# Patient Record
Sex: Male | Born: 1990 | Race: White | Hispanic: Yes | Marital: Single | Smoking: Never smoker
Health system: Southern US, Community
[De-identification: ages and names within clinical notes are randomized; demographics above are authoritative.]

## PROBLEM LIST (undated history)

## (undated) HISTORY — PX: NO PAST SURGERIES: SHX2092

---
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
## 2021-03-23 ENCOUNTER — Encounter (HOSPITAL_COMMUNITY): Payer: Self-pay | Admitting: *Deleted

## 2021-03-23 ENCOUNTER — Other Ambulatory Visit: Payer: Self-pay

## 2021-03-23 ENCOUNTER — Emergency Department (HOSPITAL_COMMUNITY)
Admission: EM | Admit: 2021-03-23 | Discharge: 2021-03-24 | Disposition: A | Discharge status: Home / Self Care | Payer: Self-pay | Attending: Emergency Medicine | Admitting: Emergency Medicine

## 2021-03-23 DIAGNOSIS — M7041 Prepatellar bursitis, right knee: Secondary | ICD-10-CM | POA: Insufficient documentation

## 2021-03-23 DIAGNOSIS — D72829 Elevated white blood cell count, unspecified: Secondary | ICD-10-CM | POA: Insufficient documentation

## 2021-03-23 DIAGNOSIS — Y939 Activity, unspecified: Secondary | ICD-10-CM | POA: Insufficient documentation

## 2021-03-23 DIAGNOSIS — L03115 Cellulitis of right lower limb: Secondary | ICD-10-CM | POA: Insufficient documentation

## 2021-03-23 MED ORDER — OXYCODONE-ACETAMINOPHEN 5-325 MG PO TABS
1.0000 | ORAL_TABLET | Freq: Once | ORAL | Status: AC
  Administered 2021-03-24: 1 via ORAL
  Filled 2021-03-23: qty 1

## 2021-03-23 MED ORDER — IBUPROFEN 400 MG PO TABS
600.0000 mg | ORAL_TABLET | Freq: Once | ORAL | Status: AC
  Administered 2021-03-24: 600 mg via ORAL
  Filled 2021-03-23: qty 1

## 2021-03-23 NOTE — ED Triage Notes (Addendum)
Emergency Medicine Provider Triage Evaluation Note  Henry Austin , a 30 y.o. male  was evaluated in triage.  Pt complains of right knee pain worsening today.  Took Tylenol around 2 PM today.  Review of Systems  Positive: Right knee pain, subjective fever Negative: Numbness, weakness, trauma  Physical Exam  BP 130/75 (BP Location: Left Arm)   Pulse (!) 106   Temp 99.6 F (37.6 C) (Oral)   Resp 16   SpO2 99%  Gen:   Awake Resp:  Normal effort  Cardiac:  Normal rate, right DP and PT pulses 2+ MSK:   Tenderness, swelling, increased warmth, erythema to the right knee.  Significant pain with attempted range of motion of the right knee. Neuro:  Speech clear.  Sensation light touch grossly intact in the right lower extremity.  Strength 5/5 in right ankle.  Medical Decision Making  Medically screening exam initiated at 11:35 PM.  Appropriate orders placed.  Henry Austin was informed that the remainder of the evaluation will be completed by another provider, this initial triage assessment does not replace that evaluation, and the importance of remaining in the ED until their evaluation is complete.  Clinical Impression   Patient seems to be quite uncomfortable in his right knee.  Significant pain with attempted range of motion.  Findings raise concern for possible infection.    Marijean Niemann #[id number], Spanish interpreter, was used used during this encounter.   Anselm Pancoast, PA-C 03/23/21 2348    Harolyn Rutherford C, PA-C 03/23/21 2352

## 2021-03-23 NOTE — ED Triage Notes (Signed)
Tpt alerthe pa saw this pt before myself  See te pas notes

## 2021-03-24 ENCOUNTER — Emergency Department (HOSPITAL_COMMUNITY): Payer: Self-pay

## 2021-03-24 LAB — BASIC METABOLIC PANEL
Anion gap: 10 (ref 5–15)
BUN: 23 mg/dL — ABNORMAL HIGH (ref 6–20)
CO2: 24 mmol/L (ref 22–32)
Calcium: 9.1 mg/dL (ref 8.9–10.3)
Chloride: 100 mmol/L (ref 98–111)
Creatinine, Ser: 1.12 mg/dL (ref 0.61–1.24)
GFR, Estimated: >60 mL/min (ref >60)
Glucose, Bld: 99 mg/dL (ref 70–99)
Potassium: 3.7 mmol/L (ref 3.5–5.1)
Sodium: 134 mmol/L — ABNORMAL LOW (ref 135–145)

## 2021-03-24 LAB — CBC WITH DIFFERENTIAL/PLATELET
Abs Immature Granulocytes: 0.18 10*3/uL — ABNORMAL HIGH (ref 0.00–0.07)
Basophils Absolute: 0.1 10*3/uL (ref 0.0–0.1)
Basophils Relative: 0 %
Eosinophils Absolute: 0 10*3/uL (ref 0.0–0.5)
Eosinophils Relative: 0 %
HCT: 42.2 % (ref 39.0–52.0)
Hemoglobin: 14.5 g/dL (ref 13.0–17.0)
Immature Granulocytes: 1 %
Lymphocytes Relative: 7 %
Lymphs Abs: 1.5 10*3/uL (ref 0.7–4.0)
MCH: 29.7 pg (ref 26.0–34.0)
MCHC: 34.4 g/dL (ref 30.0–36.0)
MCV: 86.5 fL (ref 80.0–100.0)
Monocytes Absolute: 1.5 10*3/uL — ABNORMAL HIGH (ref 0.1–1.0)
Monocytes Relative: 7 %
Neutro Abs: 17.8 10*3/uL — ABNORMAL HIGH (ref 1.7–7.7)
Neutrophils Relative %: 85 %
Platelets: 252 10*3/uL (ref 150–400)
RBC: 4.88 MIL/uL (ref 4.22–5.81)
RDW: 13.1 % (ref 11.5–15.5)
WBC: 21.1 10*3/uL — ABNORMAL HIGH (ref 4.0–10.5)
nRBC: 0 % (ref 0.0–0.2)

## 2021-03-24 LAB — LACTIC ACID, PLASMA: Lactic Acid, Venous: 1.3 mmol/L (ref 0.5–1.9)

## 2021-03-24 LAB — SEDIMENTATION RATE: Sed Rate: 2 mm/hr (ref 0–16)

## 2021-03-24 LAB — C-REACTIVE PROTEIN: CRP: 4.4 mg/dL — ABNORMAL HIGH (ref <1.0)

## 2021-03-24 MED ORDER — HYDROCODONE-ACETAMINOPHEN 5-325 MG PO TABS: 1.0000 | ORAL_TABLET | Freq: Three times a day (TID) | ORAL | 0 refills | Status: DC | PRN

## 2021-03-24 MED ORDER — HYDROMORPHONE HCL 1 MG/ML IJ SOLN
1.0000 mg | Freq: Once | INTRAMUSCULAR | Status: AC
  Administered 2021-03-24: 1 mg via INTRAVENOUS
  Filled 2021-03-24: qty 1

## 2021-03-24 MED ORDER — LIDOCAINE-EPINEPHRINE (PF) 2 %-1:200000 IJ SOLN
10.0000 mL | Freq: Once | INTRAMUSCULAR | Status: AC
  Administered 2021-03-24: 10 mL
  Filled 2021-03-24: qty 20

## 2021-03-24 MED ORDER — AMOXICILLIN-POT CLAVULANATE 875-125 MG PO TABS: 1.0000 | ORAL_TABLET | Freq: Two times a day (BID) | ORAL | 0 refills | Status: DC

## 2021-03-24 MED ORDER — CEFTRIAXONE SODIUM 2 G IJ SOLR
2.0000 g | Freq: Once | INTRAMUSCULAR | Status: AC
  Administered 2021-03-24: 2 g via INTRAVENOUS
  Filled 2021-03-24: qty 20
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
## 2021-03-24 NOTE — ED Notes (Signed)
Ortho Tech notified of need for crutches

## 2021-03-24 NOTE — Discharge Instructions (Signed)
For pain control you may take at 1000 mg of Tylenol every 8 hours scheduled.  In addition you can take 0.5 to 1 tablet of Norco/Vicodin every 8 hours for pain not controlled with the scheduled Tylenol.  Para controlar el dolor, puede tomar 1000 mg de Tylenol cada 8 horas programadas. Adems puede tomar de 0,5 a 1 pastilla de Norco/Vicodin cada 8 horas para el dolor no controlado con el Tylenol pautado.

## 2021-03-24 NOTE — ED Triage Notes (Signed)
Rt knee  Swollen with rt medial redness

## 2021-03-24 NOTE — Progress Notes (Signed)
Orthopedic Tech Progress Note Patient Details:  Henry Austin Aug 22, 1991 [id number]  Ortho Devices Type of Ortho Device: Crutches Ortho Device/Splint Interventions: Ordered,Adjustment   Post Interventions Patient Tolerated: Well Instructions Provided: Adjustment of device,Care of device,Poper ambulation with device   Henry Austin Harle Stanford 03/24/2021, 7:40 AM

## 2021-03-25 LAB — CULTURE, BLOOD (ROUTINE X 2)

## 2021-03-27 ENCOUNTER — Other Ambulatory Visit: Payer: Self-pay

## 2021-03-27 ENCOUNTER — Encounter: Payer: Self-pay | Admitting: Orthopaedic Surgery

## 2021-03-27 ENCOUNTER — Encounter (HOSPITAL_COMMUNITY): Payer: Self-pay | Admitting: Orthopaedic Surgery

## 2021-03-27 ENCOUNTER — Other Ambulatory Visit (HOSPITAL_COMMUNITY)
Admission: RE | Admit: 2021-03-27 | Discharge: 2021-03-27 | Disposition: A | Discharge status: Home / Self Care | Payer: Self-pay | Source: Ambulatory Visit | Attending: Orthopaedic Surgery | Admitting: Orthopaedic Surgery

## 2021-03-27 ENCOUNTER — Ambulatory Visit: Payer: Self-pay | Admitting: Orthopaedic Surgery

## 2021-03-27 DIAGNOSIS — M7051 Other bursitis of knee, right knee: Secondary | ICD-10-CM

## 2021-03-27 DIAGNOSIS — Z01812 Encounter for preprocedural laboratory examination: Secondary | ICD-10-CM | POA: Insufficient documentation

## 2021-03-27 DIAGNOSIS — Z20822 Contact with and (suspected) exposure to covid-19: Secondary | ICD-10-CM | POA: Insufficient documentation

## 2021-03-27 LAB — SARS CORONAVIRUS 2 (TAT 6-24 HRS): SARS Coronavirus 2: NEGATIVE

## 2021-03-27 MED ORDER — BUPIVACAINE HCL 0.25 % IJ SOLN: 2.0000 mL | INTRAMUSCULAR | Status: DC | PRN

## 2021-03-27 MED ORDER — LIDOCAINE HCL 1 % IJ SOLN: 2.0000 mL | INTRAMUSCULAR | Status: DC | PRN

## 2021-03-27 NOTE — Progress Notes (Signed)
Used [employer] ID [id number] for PAT iinformation and instructions for DOS.  PCP - none Cardiologist - n/a  Chest x-ray - n/a EKG - n/a Stress Test - n/a ECHO - n/a Cardiac Cath - n/a  STOP now taking any Aspirin (unless otherwise instructed by your surgeon), Aleve, Naproxen, Ibuprofen, Motrin, Advil, Goody's, BC's, all herbal medications, fish oil, and all vitamins.   Coronavirus Screening Covid test 03/27/21 is pending results Do you have any of the following symptoms:  Cough yes/no: No Fever (>100.74F)  yes/no: No Runny nose yes/no: No Sore throat yes/no: No Difficulty breathing/shortness of breath  yes/no: No  Have you traveled in the last 14 days and where? yes/no: No  Patient verbalized understanding of instructions that were given via phone by [employer].

## 2021-03-27 NOTE — Progress Notes (Signed)
Office Visit Note   Patient: Henry Austin           Date of Birth: February 26, 1991           MRN: [id number] Visit Date: 03/27/2021              Requested by: No referring provider defined for this encounter. PCP: Pcp, No   Assessment & Plan: Visit Diagnoses:  1. Patellar bursitis of right knee     Plan: Impression is right knee septic prepatellar bursitis.  Today, we aspirated the prepatellar bursa and obtained 5 cc of milky yellow fluid.  This was sent off for culture.  At this point, we recommend surgical debridement based on his lack of response from the antibiotics that he has been on over the past 4 days.  Risk, benefits and possible occasions reviewed.  Rehab recovery time discussed.  All questions were answered.  Follow-Up Instructions: Return for post-op.   Orders:  Orders Placed This Encounter  Procedures  . Large Joint Inj: R knee   No orders of the defined types were placed in this encounter.     Procedures: Large Joint Inj: R knee on 03/27/2021 10:22 AM Indications: pain Details: 22 G needle, anterolateral approach Medications: 2 mL lidocaine 1 %; 2 mL bupivacaine 0.25 %      Clinical Data: No additional findings.   Subjective: Chief Complaint  Patient presents with  . Right Knee - Pain    HPI patient is a pleasant 30 year old Spanish-speaking gentleman who comes in today with right knee pain for the past 5 days.  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  He denies any recent abrasion or bug bite.  He has had subjective fevers and chills Saturday and Sunday but notes that this somewhat improved Monday and Tuesday.  He is feeling better today but not great.  He was seen in the ED 4 days ago where he was started on Augmentin.  The pain he is having now is to the anterior aspect.  Worse with flexion of the knee as well as with ambulation.  He has been taking  Norco with relief of symptoms.  No history of gout.  No previous cortisone injection to the right knee.  Review of Systems as detailed in HPI.  All others reviewed and are negative.   Objective: Vital Signs: There were no vitals taken for this visit.  Physical Exam well-developed well-nourished gentleman in no acute distress.  Alert and oriented x3.  Ortho Exam right knee exam shows mild to moderate swelling over the prepatellar bursa.  This is slightly erythematous and tender.  There is warmth as well.  No induration.  No drainage.  Range of motion 0 to 110 degrees.  No joint line tenderness.  He is neurovascular intact distally.  Specialty Comments:  No specialty comments available.  Imaging: No new imaging   PMFS History: There are no problems to display for this patient.  History reviewed. No pertinent past medical history.  History reviewed. No pertinent family history.  History reviewed. No pertinent surgical history. Social History   Occupational History  . Not on file  Tobacco Use  . Smoking status: Not on file  . Smokeless tobacco: Not on file  Substance and Sexual Activity  . Alcohol use: Not on file  . Drug use: Not on file  . Sexual activity:  Not on file

## 2021-03-27 NOTE — Progress Notes (Deleted)
  The note originally documented on this encounter has been moved the the encounter in which it belongs.  

## 2021-03-28 ENCOUNTER — Encounter (HOSPITAL_COMMUNITY)
Admission: RE | Disposition: A | Discharge status: Home / Self Care | Payer: Self-pay | Source: Home / Self Care | Attending: Orthopaedic Surgery

## 2021-03-28 ENCOUNTER — Ambulatory Visit (HOSPITAL_COMMUNITY): Payer: Self-pay | Admitting: Certified Registered Nurse Anesthetist

## 2021-03-28 ENCOUNTER — Ambulatory Visit (HOSPITAL_COMMUNITY)
Admission: RE | Admit: 2021-03-28 | Discharge: 2021-03-28 | Disposition: A | Discharge status: Home / Self Care | Payer: Self-pay | Attending: Orthopaedic Surgery | Admitting: Orthopaedic Surgery

## 2021-03-28 ENCOUNTER — Other Ambulatory Visit: Payer: Self-pay

## 2021-03-28 ENCOUNTER — Encounter (HOSPITAL_COMMUNITY): Payer: Self-pay | Admitting: Orthopaedic Surgery

## 2021-03-28 DIAGNOSIS — M71161 Other infective bursitis, right knee: Secondary | ICD-10-CM

## 2021-03-28 DIAGNOSIS — M7041 Prepatellar bursitis, right knee: Secondary | ICD-10-CM | POA: Insufficient documentation

## 2021-03-28 HISTORY — PX: IRRIGATION AND DEBRIDEMENT KNEE: SHX5185

## 2021-03-28 LAB — CBC WITH DIFFERENTIAL/PLATELET
Abs Immature Granulocytes: 0.09 10*3/uL — ABNORMAL HIGH (ref 0.00–0.07)
Basophils Absolute: 0.1 10*3/uL (ref 0.0–0.1)
Basophils Relative: 1 %
Eosinophils Absolute: 0.2 10*3/uL (ref 0.0–0.5)
Eosinophils Relative: 2 %
HCT: 42.4 % (ref 39.0–52.0)
Hemoglobin: 14.2 g/dL (ref 13.0–17.0)
Immature Granulocytes: 1 %
Lymphocytes Relative: 21 %
Lymphs Abs: 2.3 10*3/uL (ref 0.7–4.0)
MCH: 29.1 pg (ref 26.0–34.0)
MCHC: 33.5 g/dL (ref 30.0–36.0)
MCV: 86.9 fL (ref 80.0–100.0)
Monocytes Absolute: 1 10*3/uL (ref 0.1–1.0)
Monocytes Relative: 9 %
Neutro Abs: 7.3 10*3/uL (ref 1.7–7.7)
Neutrophils Relative %: 66 %
Platelets: 359 10*3/uL (ref 150–400)
RBC: 4.88 MIL/uL (ref 4.22–5.81)
RDW: 13.3 % (ref 11.5–15.5)
WBC: 10.9 10*3/uL — ABNORMAL HIGH (ref 4.0–10.5)
nRBC: 0 % (ref 0.0–0.2)

## 2021-03-28 LAB — CULTURE, BLOOD (ROUTINE X 2)

## 2021-03-28 SURGERY — IRRIGATION AND DEBRIDEMENT KNEE
Anesthesia: General | Site: Knee | Laterality: Right

## 2021-03-28 MED ORDER — LIDOCAINE 2% (20 MG/ML) 5 ML SYRINGE
INTRAMUSCULAR | Status: AC
  Filled 2021-03-28: qty 5

## 2021-03-28 MED ORDER — VANCOMYCIN HCL 500 MG IV SOLR
INTRAVENOUS | Status: AC
  Filled 2021-03-28: qty 500

## 2021-03-28 MED ORDER — LACTATED RINGERS IV SOLN: INTRAVENOUS | Status: DC

## 2021-03-28 MED ORDER — ACETAMINOPHEN 500 MG PO TABS: 1000.0000 mg | ORAL_TABLET | Freq: Once | ORAL | Status: DC

## 2021-03-28 MED ORDER — MIDAZOLAM HCL 5 MG/5ML IJ SOLN
INTRAMUSCULAR | Status: DC | PRN
  Administered 2021-03-28: 2 mg via INTRAVENOUS

## 2021-03-28 MED ORDER — VANCOMYCIN HCL 500 MG IV SOLR
INTRAVENOUS | Status: DC | PRN
  Administered 2021-03-28: 500 mg via TOPICAL

## 2021-03-28 MED ORDER — CEFAZOLIN SODIUM-DEXTROSE 2-3 GM-%(50ML) IV SOLR
INTRAVENOUS | Status: DC | PRN
  Administered 2021-03-28: 2 g via INTRAVENOUS

## 2021-03-28 MED ORDER — DEXAMETHASONE SODIUM PHOSPHATE 10 MG/ML IJ SOLN
INTRAMUSCULAR | Status: DC | PRN
  Administered 2021-03-28: 10 mg via INTRAVENOUS

## 2021-03-28 MED ORDER — DEXAMETHASONE SODIUM PHOSPHATE 10 MG/ML IJ SOLN
INTRAMUSCULAR | Status: AC
  Filled 2021-03-28: qty 1

## 2021-03-28 MED ORDER — BUPIVACAINE-EPINEPHRINE 0.25% -1:200000 IJ SOLN
INTRAMUSCULAR | Status: DC | PRN
  Administered 2021-03-28: 5 mL

## 2021-03-28 MED ORDER — LIDOCAINE 2% (20 MG/ML) 5 ML SYRINGE
INTRAMUSCULAR | Status: DC | PRN
  Administered 2021-03-28: 60 mg via INTRAVENOUS

## 2021-03-28 MED ORDER — HYDROMORPHONE HCL 1 MG/ML IJ SOLN
INTRAMUSCULAR | Status: AC
  Filled 2021-03-28: qty 1

## 2021-03-28 MED ORDER — CHLORHEXIDINE GLUCONATE 0.12 % MT SOLN
15.0000 mL | Freq: Once | OROMUCOSAL | Status: AC
  Administered 2021-03-28: 15 mL via OROMUCOSAL

## 2021-03-28 MED ORDER — OXYCODONE-ACETAMINOPHEN 5-325 MG PO TABS: 1.0000 | ORAL_TABLET | Freq: Three times a day (TID) | ORAL | 0 refills | Status: DC | PRN

## 2021-03-28 MED ORDER — SODIUM CHLORIDE 0.9 % IR SOLN
Status: DC | PRN
  Administered 2021-03-28: 6000 mL

## 2021-03-28 MED ORDER — ONDANSETRON HCL 4 MG/2ML IJ SOLN
INTRAMUSCULAR | Status: AC
  Filled 2021-03-28: qty 2

## 2021-03-28 MED ORDER — PROPOFOL 10 MG/ML IV BOLUS
INTRAVENOUS | Status: AC
  Filled 2021-03-28: qty 20

## 2021-03-28 MED ORDER — BUPIVACAINE HCL (PF) 0.25 % IJ SOLN
INTRAMUSCULAR | Status: AC
  Filled 2021-03-28: qty 30

## 2021-03-28 MED ORDER — FENTANYL CITRATE (PF) 250 MCG/5ML IJ SOLN
INTRAMUSCULAR | Status: DC | PRN
  Administered 2021-03-28: 50 ug via INTRAVENOUS

## 2021-03-28 MED ORDER — MIDAZOLAM HCL 2 MG/2ML IJ SOLN
INTRAMUSCULAR | Status: AC
  Filled 2021-03-28: qty 2

## 2021-03-28 MED ORDER — HYDROMORPHONE HCL 1 MG/ML IJ SOLN
0.2500 mg | INTRAMUSCULAR | Status: DC | PRN
  Administered 2021-03-28: 0.25 mg via INTRAVENOUS
  Administered 2021-03-28: 0.5 mg via INTRAVENOUS
  Administered 2021-03-28: 0.25 mg via INTRAVENOUS

## 2021-03-28 MED ORDER — ONDANSETRON HCL 4 MG/2ML IJ SOLN
INTRAMUSCULAR | Status: DC | PRN
  Administered 2021-03-28: 4 mg via INTRAVENOUS

## 2021-03-28 MED ORDER — CELECOXIB 200 MG PO CAPS
200.0000 mg | ORAL_CAPSULE | Freq: Once | ORAL | Status: AC
  Administered 2021-03-28: 200 mg via ORAL
  Filled 2021-03-28: qty 1

## 2021-03-28 MED ORDER — FENTANYL CITRATE (PF) 250 MCG/5ML IJ SOLN
INTRAMUSCULAR | Status: AC
  Filled 2021-03-28: qty 5

## 2021-03-28 MED ORDER — PROPOFOL 10 MG/ML IV BOLUS
INTRAVENOUS | Status: DC | PRN
  Administered 2021-03-28: 200 mg via INTRAVENOUS

## 2021-03-28 SURGICAL SUPPLY — 48 items
BNDG CMPR MED 15X6 ELC VLCR LF (GAUZE/BANDAGES/DRESSINGS) ×1
BNDG COHESIVE 6X5 TAN STRL LF (GAUZE/BANDAGES/DRESSINGS) ×3 IMPLANT
BNDG ELASTIC 6X15 VLCR STRL LF (GAUZE/BANDAGES/DRESSINGS) ×1 IMPLANT
BNDG ELASTIC 6X5.8 VLCR STR LF (GAUZE/BANDAGES/DRESSINGS) ×1 IMPLANT
BNDG GAUZE ELAST 4 BULKY (GAUZE/BANDAGES/DRESSINGS) ×1 IMPLANT
COVER SURGICAL LIGHT HANDLE (MISCELLANEOUS) ×2 IMPLANT
COVER WAND RF STERILE (DRAPES) ×2 IMPLANT
CUFF TOURN SGL QUICK 34 (TOURNIQUET CUFF)
CUFF TOURN SGL QUICK 42 (TOURNIQUET CUFF) ×2 IMPLANT
CUFF TRNQT CYL 34X4.125X (TOURNIQUET CUFF) IMPLANT
DRAPE EXTREMITY T 121X128X90 (DISPOSABLE) ×1 IMPLANT
DRAPE IMP U-DRAPE 54X76 (DRAPES) ×2 IMPLANT
DRAPE ORTHO SPLIT 77X108 STRL (DRAPES) ×4
DRAPE SURG ORHT 6 SPLT 77X108 (DRAPES) ×1 IMPLANT
ELECT CAUTERY BLADE 6.4 (BLADE) ×4 IMPLANT
ELECT REM PT RETURN 9FT ADLT (ELECTROSURGICAL) ×2
ELECTRODE REM PT RTRN 9FT ADLT (ELECTROSURGICAL) ×1 IMPLANT
FACESHIELD WRAPAROUND (MASK) IMPLANT
FACESHIELD WRAPAROUND OR TEAM (MASK) IMPLANT
GAUZE SPONGE 4X4 12PLY STRL LF (GAUZE/BANDAGES/DRESSINGS) ×1 IMPLANT
GLOVE ECLIPSE 7.0 STRL STRAW (GLOVE) ×2 IMPLANT
GLOVE SKINSENSE NS SZ7.5 (GLOVE) ×4
GLOVE SKINSENSE STRL SZ7.5 (GLOVE) ×4 IMPLANT
GLOVE SURG UNDER POLY LF SZ7 (GLOVE) ×2 IMPLANT
GOWN STRL REIN XL XLG (GOWN DISPOSABLE) ×4 IMPLANT
HANDPIECE INTERPULSE COAX TIP (DISPOSABLE) ×4
KIT BASIN OR (CUSTOM PROCEDURE TRAY) ×2 IMPLANT
KIT TURNOVER KIT B (KITS) ×2 IMPLANT
MANIFOLD NEPTUNE II (INSTRUMENTS) ×2 IMPLANT
NS IRRIG 1000ML POUR BTL (IV SOLUTION) ×2 IMPLANT
PACK ORTHO EXTREMITY (CUSTOM PROCEDURE TRAY) ×2 IMPLANT
PACK UNIVERSAL I (CUSTOM PROCEDURE TRAY) ×2 IMPLANT
PAD ARMBOARD 7.5X6 YLW CONV (MISCELLANEOUS) ×4 IMPLANT
SET CYSTO W/LG BORE CLAMP LF (SET/KITS/TRAYS/PACK) ×1 IMPLANT
SET HNDPC FAN SPRY TIP SCT (DISPOSABLE) ×1 IMPLANT
SPONGE LAP 18X18 RF (DISPOSABLE) ×2 IMPLANT
STOCKINETTE IMPERVIOUS 9X36 MD (GAUZE/BANDAGES/DRESSINGS) ×2 IMPLANT
SUT ETHILON 2 0 FS 18 (SUTURE) ×2 IMPLANT
SUT MON AB 2-0 CT1 36 (SUTURE) ×2 IMPLANT
SWAB CULTURE ESWAB REG 1ML (MISCELLANEOUS) ×3 IMPLANT
SWAB CULTURE LIQ STUART DBL (MISCELLANEOUS) ×1 IMPLANT
SYR 20CC LL (SYRINGE) ×1 IMPLANT
TOWEL GREEN STERILE (TOWEL DISPOSABLE) ×2 IMPLANT
TOWEL GREEN STERILE FF (TOWEL DISPOSABLE) ×2 IMPLANT
TUBE CONNECTING 12X1/4 (SUCTIONS) ×2 IMPLANT
UNDERPAD 30X36 HEAVY ABSORB (UNDERPADS AND DIAPERS) ×2 IMPLANT
WATER STERILE IRR 1000ML POUR (IV SOLUTION) ×2 IMPLANT
YANKAUER SUCT BULB TIP NO VENT (SUCTIONS) ×2 IMPLANT

## 2021-03-28 NOTE — Anesthesia Postprocedure Evaluation (Signed)
Anesthesia Post Note  Patient: Henry Austin  Procedure(s) Performed: RIGHT KNEE PREPATELLAR  IRRIGATION AND DEBRIDEMENT (Right Knee)     Patient location during evaluation: PACU Anesthesia Type: General Level of consciousness: awake and alert Pain management: pain level controlled Vital Signs Assessment: post-procedure vital signs reviewed and stable Respiratory status: spontaneous breathing, nonlabored ventilation and respiratory function stable Cardiovascular status: blood pressure returned to baseline and stable Postop Assessment: no apparent nausea or vomiting Anesthetic complications: no   No complications documented.  Last Vitals:  Vitals:   03/28/21 1320 03/28/21 1335  BP: 120/83 125/86  Pulse: 70 67  Resp: 10 13  Temp:  36.6 C  SpO2: 98% 98%    Last Pain:  Vitals:   03/28/21 1335  PainSc: 4                  Humna Moorehouse,W. EDMOND

## 2021-03-28 NOTE — Op Note (Signed)
   Date of Surgery: 03/28/2021  INDICATIONS: Mr. Weightman is a 30 y.o.-year-old male with a right septic prepatellar bursitis that failed outpatient oral antibiotics.  The patient did consent to the procedure after discussion of the risks and benefits.  PREOPERATIVE DIAGNOSIS: Septic prepatellar bursitis right knee  POSTOPERATIVE DIAGNOSIS: Same.  PROCEDURE:  1.  Incision and drainage of right knee prepatellar bursa 2.  Excision of right knee prepatellar bursa 3.  Aspiration of right knee joint  Debridement type: Excisional Debridement  Side: right  Body Location: prepatellar bursa  Tools used for debridement: scalpel and rongeur  SURGEON: N. Glee Arvin, M.D.  ASSIST: Starlyn Skeans [place], [place]; necessary for the timely completion of procedure and due to complexity of procedure.  ANESTHESIA:  general  IV FLUIDS AND URINE: See anesthesia.  ESTIMATED BLOOD LOSS: Minimal mL.  IMPLANTS: None  DRAINS: Wet to dry  COMPLICATIONS: see description of procedure.  DESCRIPTION OF PROCEDURE: The patient was brought to the operating room.  The patient had been signed prior to the procedure and this was documented. The patient had the anesthesia placed by the anesthesiologist.  A time-out was performed to confirm that this was the correct patient, site, side and location. The patient did receive antibiotics prior to the incision and was re-dosed during the procedure as needed at indicated intervals.  A tourniquet was placed.  The patient had the operative extremity prepped and draped in the standard surgical fashion.    A longitudinal incision was made directly over the fluctuant bursa and there was return of frank pus immediately which was cultured.  Antibiotics were then started.  Sharp excisional debridement with a rondure was then performed of the prepatellar bursa.  The bursa was excised.  After I was pleased with the debridement this was then thoroughly irrigated with 6 L of normal  saline.  I then I aspirated the right knee joint which revealed just a couple cc of normal joint fluid.  There is no evidence of septic arthritis.  500 mg of vancomycin powder was placed in the open wound which was then packed with wet-to-dry dressings and a sterile dressing was applied.  Patient tolerated procedure well had no immediate complications.  Tessa Lerner was necessary for opening, closing, retracting, limb positioning and overall facilitation and timely completion of the procedure.  POSTOPERATIVE PLAN: Patient will be discharged home today we will follow-up on the cultures.  I have asked him to follow-up with me back in the office tomorrow so that we can initiate dressing changes.  For now he will remain on Augmentin.  Mayra Reel, MD 12:58 PM

## 2021-03-28 NOTE — H&P (Signed)

## 2021-03-28 NOTE — Anesthesia Procedure Notes (Signed)
Procedure Name: Intubation Date/Time: 03/28/2021 12:04 PM Performed by: Shary Decamp, CRNA Pre-anesthesia Checklist: Patient identified, Patient being monitored, Timeout performed, Emergency Drugs available and Suction available Patient Re-evaluated:Patient Re-evaluated prior to induction Oxygen Delivery Method: Circle system utilized Preoxygenation: Pre-oxygenation with 100% oxygen Induction Type: IV induction Ventilation: Mask ventilation without difficulty LMA Size: 5.0 Placement Confirmation: positive ETCO2 and breath sounds checked- equal and bilateral Tube secured with: Tape Dental Injury: Teeth and Oropharynx as per pre-operative assessment

## 2021-03-28 NOTE — Transfer of Care (Signed)
Immediate Anesthesia Transfer of Care Note  Patient: Henry Austin  Procedure(s) Performed: RIGHT KNEE PREPATELLAR  IRRIGATION AND DEBRIDEMENT (Right Knee)  Patient Location: PACU  Anesthesia Type:General  Level of Consciousness: drowsy, patient cooperative and responds to stimulation  Airway & Oxygen Therapy: Patient Spontanous Breathing and Patient connected to face mask oxygen  Post-op Assessment: Report given to RN and Post -op Vital signs reviewed and stable  Post vital signs: Reviewed and stable  Last Vitals:  Vitals Value Taken Time  BP    Temp    Pulse 76 03/28/21 1253  Resp 20 03/28/21 1253  SpO2 100 % 03/28/21 1253  Vitals shown include unvalidated device data.  Last Pain:  Vitals:   03/28/21 1122  PainSc: 4          Complications: No complications documented.

## 2021-03-28 NOTE — Discharge Instructions (Signed)
   Postoperative instructions:  Weightbearing instructions: activity as tolerated.  Do not kneel onto your knee.  Dressing instructions: Keep your dressing and/or splint clean and dry at all times.  It will be removed at your first post-operative appointment.  Your stitches and/or staples will be removed at this visit.  Pain control:  You have been given a prescription to be taken as directed for post-operative pain control.  In addition, elevate the operative extremity above the heart at all times to prevent swelling and throbbing pain.  Take over-the-counter Colace, 100mg  by mouth twice a day while taking narcotic pain medications to help prevent constipation.  Follow up appointments: 1) Follow up Friday 03/29/21 for wound check.  -------------------------------------------------------------------------------------------------------------  After Surgery Pain Control:  After your surgery, post-surgical discomfort or pain is likely. This discomfort can last several days to a few weeks. At certain times of the day your discomfort may be more intense.  Did you receive a nerve block?  A nerve block can provide pain relief for one hour to two days after your surgery. As long as the nerve block is working, you will experience little or no sensation in the area the surgeon operated on.  As the nerve block wears off, you will begin to experience pain or discomfort. It is very important that you begin taking your prescribed pain medication before the nerve block fully wears off. Treating your pain at the first sign of the block wearing off will ensure your pain is better controlled and more tolerable when full-sensation returns. Do not wait until the pain is intolerable, as the medicine will be less effective. It is better to treat pain in advance than to try and catch up.  General Anesthesia:  If you did not receive a nerve block during your surgery, you will need to start taking your pain medication  shortly after your surgery and should continue to do so as prescribed by your surgeon.  Pain Medication:  Most commonly we prescribe Vicodin and Percocet for post-operative pain. Both of these medications contain a combination of acetaminophen (Tylenol) and a narcotic to help control pain.   It takes between 30 and 45 minutes before pain medication starts to work. It is important to take your medication before your pain level gets too intense.   Nausea is a common side effect of many pain medications. You will want to eat something before taking your pain medicine to help prevent nausea.   If you are taking a prescription pain medication that contains acetaminophen, we recommend that you do not take additional over the counter acetaminophen (Tylenol).  Other pain relieving options:   Using a cold pack to ice the affected area a few times a day (15 to 20 minutes at a time) can help to relieve pain, reduce swelling and bruising.   Elevation of the affected area can also help to reduce pain and swelling.

## 2021-03-28 NOTE — Anesthesia Preprocedure Evaluation (Addendum)
Anesthesia Evaluation  Patient identified by MRN, date of birth, ID band Patient awake    Reviewed: Allergy & Precautions, H&P , NPO status , Patient's Chart, lab work & pertinent test results  Airway Mallampati: II  TM Distance: >3 FB Neck ROM: Full    Dental no notable dental hx. (+) Teeth Intact, Dental Advisory Given   Pulmonary neg pulmonary ROS,    Pulmonary exam normal breath sounds clear to auscultation       Cardiovascular negative cardio ROS   Rhythm:Regular Rate:Normal     Neuro/Psych negative neurological ROS  negative psych ROS   GI/Hepatic negative GI ROS, Neg liver ROS,   Endo/Other  negative endocrine ROS  Renal/GU negative Renal ROS  negative genitourinary   Musculoskeletal  (+) Arthritis ,   Abdominal   Peds  Hematology negative hematology ROS (+)   Anesthesia Other Findings   Reproductive/Obstetrics negative OB ROS                            Anesthesia Physical Anesthesia Plan  ASA: II  Anesthesia Plan: General   Post-op Pain Management:    Induction: Intravenous  PONV Risk Score and Plan: 3 and Ondansetron, Dexamethasone and Midazolam  Airway Management Planned: LMA  Additional Equipment:   Intra-op Plan:   Post-operative Plan: Extubation in OR  Informed Consent: I have reviewed the patients History and Physical, chart, labs and discussed the procedure including the risks, benefits and alternatives for the proposed anesthesia with the patient or authorized representative who has indicated his/her understanding and acceptance.     Dental advisory given and Interpreter used for interveiw  Plan Discussed with: CRNA  Anesthesia Plan Comments:        Anesthesia Quick Evaluation

## 2021-03-29 ENCOUNTER — Ambulatory Visit (INDEPENDENT_AMBULATORY_CARE_PROVIDER_SITE_OTHER): Payer: Self-pay | Admitting: Physician Assistant

## 2021-03-29 ENCOUNTER — Encounter (HOSPITAL_COMMUNITY): Payer: Self-pay | Admitting: Orthopaedic Surgery

## 2021-03-29 DIAGNOSIS — M71161 Other infective bursitis, right knee: Secondary | ICD-10-CM

## 2021-03-29 LAB — CULTURE, BLOOD (ROUTINE X 2)
Culture: NO GROWTH
Culture: NO GROWTH
Special Requests: ADEQUATE

## 2021-03-29 NOTE — Progress Notes (Signed)
   Post-Op Visit Note   Patient: Henry Austin           Date of Birth: 07-19-1991           MRN: [id number] Visit Date: 03/29/2021 PCP: Pcp, No   Assessment & Plan:  Chief Complaint:  Chief Complaint  Patient presents with  . Right Knee - Routine Post Op   Visit Diagnoses:  1. Septic prepatellar bursitis of right knee     Plan: Patient is a very pleasant 30 year old who comes in today with his wife.  He is postop day 1 irrigation and debridement left prepatellar bursa.  He has been doing okay.  He is taking oxycodone for pain relief.  No fevers or chills.  He is still taking his Augmentin.  Examination office right knee reveals slight warmth and erythema.  No induration.  Moderate tenderness.  Today, I removed the wet-to-dry dressings and repacked this with sterile 4 x 4's and sterile normal saline.  This was then wrapped with Kerlix and Ace bandage.  We also taught the wife how to do this at home.  She will do this twice daily over the next week.  He will continue with the Augmentin until the cultures have grown and we are able to tailor the antibiotic treatment.  We will see him back in the office in 1 week's time for recheck.  I did mark the erythematous area with a surgical marker.  He has been instructed to not get this area wet.  He will call us if he has any worsening symptoms, fevers or chills.  Follow-Up Instructions: Return in about 1 week (around 04/05/2021).   Orders:  No orders of the defined types were placed in this encounter.  No orders of the defined types were placed in this encounter.   Imaging: No new imaging  PMFS History: Patient Active Problem List   Diagnosis Date Noted  . Septic prepatellar bursitis of right knee 03/28/2021   History reviewed. No pertinent past medical history.  History reviewed. No pertinent family history.  Past Surgical History:  Procedure Laterality Date  . IRRIGATION AND DEBRIDEMENT KNEE Right 03/28/2021   Procedure: RIGHT KNEE  PREPATELLAR  IRRIGATION AND DEBRIDEMENT;  Surgeon: Tarry Kos, MD;  Location: [facility] OR;  Service: Orthopedics;  Laterality: Right;  . NO PAST SURGERIES     Social History   Occupational History  . Not on file  Tobacco Use  . Smoking status: Never Smoker  . Smokeless tobacco: Never Used  Vaping Use  . Vaping Use: Never used  Substance and Sexual Activity  . Alcohol use: Not Currently    Comment: none since 2020  . Drug use: Never  . Sexual activity: Yes

## 2021-04-02 LAB — ANAEROBIC AND AEROBIC CULTURE
MICRO NUMBER:: 11820911
MICRO NUMBER:: 11820912
SPECIMEN QUALITY:: ADEQUATE
SPECIMEN QUALITY:: ADEQUATE

## 2021-04-02 LAB — AEROBIC/ANAEROBIC CULTURE W GRAM STAIN (SURGICAL/DEEP WOUND): Gram Stain: NONE SEEN

## 2021-04-04 ENCOUNTER — Ambulatory Visit (INDEPENDENT_AMBULATORY_CARE_PROVIDER_SITE_OTHER): Payer: Self-pay | Admitting: Physician Assistant

## 2021-04-04 DIAGNOSIS — M71161 Other infective bursitis, right knee: Secondary | ICD-10-CM

## 2021-04-04 MED ORDER — AMOXICILLIN-POT CLAVULANATE 875-125 MG PO TABS: 1.0000 | ORAL_TABLET | Freq: Two times a day (BID) | ORAL | 0 refills | Status: AC

## 2021-04-04 MED ORDER — OXYCODONE-ACETAMINOPHEN 5-325 MG PO TABS: 1.0000 | ORAL_TABLET | Freq: Three times a day (TID) | ORAL | 0 refills | Status: DC | PRN

## 2021-04-04 NOTE — Progress Notes (Signed)
   Post-Op Visit Note   Patient: Henry Austin           Date of Birth: Aug 01, 1991           MRN: [id number] Visit Date: 04/04/2021 PCP: Pcp, No   Assessment & Plan:  Chief Complaint: No chief complaint on file.  Visit Diagnoses:  1. Septic prepatellar bursitis of right knee     Plan: Patient is a pleasant 30 year old gentleman who comes in today approximately 1 week status post excision right knee septic prepatellar bursa.  Intraoperative cultures grew  STREPTOCOCCUS AGALACTIAE.  He has been compliant taking the Augmentin.  He notes he only has 3 pills left.  He has also been taking Percocet for pain.  He notes that his symptoms have improved.  He has also been compliant with wet-to-dry dressing changes twice daily.  Examination of the right knee shows great granulation tissue to the wound.  No signs of infection.  There is no erythema or induration.  Calf soft nontender.  He is neurovascular intact distally.  Today, the wound was repacked with wet-to-dry dressings.  He will continue doing this twice daily.  I refilled his Augmentin and Percocet.  He will follow-up with [place] in 1 week's time for recheck.  Call with concerns or questions in the meantime.  Follow-Up Instructions: Return in about 1 week (around 04/11/2021).   Orders:  No orders of the defined types were placed in this encounter.  Meds ordered this encounter  Medications  . amoxicillin-clavulanate (AUGMENTIN) 875-125 MG tablet    Sig: Take 1 tablet by mouth every 12 (twelve) hours for 14 days.    Dispense:  28 tablet    Refill:  0  . oxyCODONE-acetaminophen (PERCOCET) 5-325 MG tablet    Sig: Take 1-2 tablets by mouth every 8 (eight) hours as needed for severe pain.    Dispense:  20 tablet    Refill:  0    Imaging: No new imaging  PMFS History: Patient Active Problem List   Diagnosis Date Noted  . Septic prepatellar bursitis of right knee 03/28/2021   No past medical history on file.  No family history on file.   Past Surgical History:  Procedure Laterality Date  . IRRIGATION AND DEBRIDEMENT KNEE Right 03/28/2021   Procedure: RIGHT KNEE PREPATELLAR  IRRIGATION AND DEBRIDEMENT;  Surgeon: Tarry Kos, MD;  Location: [facility] OR;  Service: Orthopedics;  Laterality: Right;  . NO PAST SURGERIES     Social History   Occupational History  . Not on file  Tobacco Use  . Smoking status: Never Smoker  . Smokeless tobacco: Never Used  Vaping Use  . Vaping Use: Never used  Substance and Sexual Activity  . Alcohol use: Not Currently    Comment: none since 2020  . Drug use: Never  . Sexual activity: Yes

## 2021-04-11 ENCOUNTER — Other Ambulatory Visit: Payer: Self-pay | Admitting: Physician Assistant

## 2021-04-11 ENCOUNTER — Encounter: Payer: Self-pay | Admitting: Physician Assistant

## 2021-04-11 ENCOUNTER — Ambulatory Visit (INDEPENDENT_AMBULATORY_CARE_PROVIDER_SITE_OTHER): Payer: Self-pay | Admitting: Physician Assistant

## 2021-04-11 DIAGNOSIS — M71161 Other infective bursitis, right knee: Secondary | ICD-10-CM

## 2021-04-11 MED ORDER — OXYCODONE-ACETAMINOPHEN 5-325 MG PO TABS: 1.0000 | ORAL_TABLET | Freq: Two times a day (BID) | ORAL | 0 refills | Status: AC | PRN

## 2021-04-11 NOTE — Progress Notes (Signed)
   Post-Op Visit Note   Patient: Henry Austin           Date of Birth: [birth year]           MRN: [id number] Visit Date: 04/11/2021 PCP: Pcp, No   Assessment & Plan:  Chief Complaint:  Chief Complaint  Patient presents with  . Right Knee - Routine Post Op, Follow-up   Visit Diagnoses:  1. Septic prepatellar bursitis of right knee     Plan: Patient is a pleasant 30 year old gentleman who comes in today 2 weeks out right knee excision septic prepatellar bursa.  He has been doing well.  He has been compliant with wet-to-dry dressing changes twice daily.  He has been compliant with taking his Augmentin as well.  No fevers or chills.  He notes that his pain has much improved.  Examination of his right knee shows continued granulation tissue to the wound.  He has very minimal erythema and warmth.  Calf is soft nontender.  He is neurovascularly intact distally.  Today, the wound was redressed.  They will continue with wet-to-dry dressings over the next week.  He will continue with his Augmentin.  I refilled his Percocet.  He will follow-up with [place] in 1 week's time for recheck.  Call with any concerns or questions in the meantime.  Follow-Up Instructions: Return in about 1 week (around 04/18/2021).   Orders:  No orders of the defined types were placed in this encounter.  No orders of the defined types were placed in this encounter.   Imaging: No new imaging  PMFS History: Patient Active Problem List   Diagnosis Date Noted  . Septic prepatellar bursitis of right knee 03/28/2021   History reviewed. No pertinent past medical history.  History reviewed. No pertinent family history.  Past Surgical History:  Procedure Laterality Date  . IRRIGATION AND DEBRIDEMENT KNEE Right 03/28/2021   Procedure: RIGHT KNEE PREPATELLAR  IRRIGATION AND DEBRIDEMENT;  Surgeon: Tarry Kos, MD;  Location: [facility] OR;  Service: Orthopedics;  Laterality: Right;  . NO PAST SURGERIES     Social History    Occupational History  . Not on file  Tobacco Use  . Smoking status: Never Smoker  . Smokeless tobacco: Never Used  Vaping Use  . Vaping Use: Never used  Substance and Sexual Activity  . Alcohol use: Not Currently    Comment: none since 2020  . Drug use: Never  . Sexual activity: Yes

## 2021-04-18 ENCOUNTER — Ambulatory Visit (INDEPENDENT_AMBULATORY_CARE_PROVIDER_SITE_OTHER): Payer: Self-pay | Admitting: Physician Assistant

## 2021-04-18 ENCOUNTER — Encounter: Payer: Self-pay | Admitting: Orthopaedic Surgery

## 2021-04-18 DIAGNOSIS — M71161 Other infective bursitis, right knee: Secondary | ICD-10-CM

## 2021-04-18 NOTE — Progress Notes (Signed)
   Post-Op Visit Note   Patient: Henry Austin           Date of Birth: 03-Sep-1991           MRN: [id number] Visit Date: 04/18/2021 PCP: Pcp, No   Assessment & Plan:  Chief Complaint:  Chief Complaint  Patient presents with  . Right Knee - Pain   Visit Diagnoses:  1. Septic prepatellar bursitis of right knee     Plan: Patient is a pleasant 30 year old gentleman who comes in today 3 weeks out right knee septic prepatellar bursa.  He has been been doing wet-to-dry dressing changes.  He has minimal pain which is relieved with Tylenol.  He is still taking his Augmentin.  Examination of the right knee reveals a nearly full heel wound without complication.  No signs of infection or cellulitis.  At this point, he no longer needs to do wet-to-dry dressing changes.  We have provided him with mupirocin and Band-Aids which she will use twice daily until this is completely healed.  We will avoid any kneeling until this is fully healed.  He will follow-up with [place] as needed.  Follow-Up Instructions: Return if symptoms worsen or fail to improve.   Orders:  No orders of the defined types were placed in this encounter.  No orders of the defined types were placed in this encounter.   Imaging: No new imaging  PMFS History: Patient Active Problem List   Diagnosis Date Noted  . Septic prepatellar bursitis of right knee 03/28/2021   History reviewed. No pertinent past medical history.  History reviewed. No pertinent family history.  Past Surgical History:  Procedure Laterality Date  . IRRIGATION AND DEBRIDEMENT KNEE Right 03/28/2021   Procedure: RIGHT KNEE PREPATELLAR  IRRIGATION AND DEBRIDEMENT;  Surgeon: Tarry Kos, MD;  Location: [facility] OR;  Service: Orthopedics;  Laterality: Right;  . NO PAST SURGERIES     Social History   Occupational History  . Not on file  Tobacco Use  . Smoking status: Never Smoker  . Smokeless tobacco: Never Used  Vaping Use  . Vaping Use: Never used   Substance and Sexual Activity  . Alcohol use: Not Currently    Comment: none since 2020  . Drug use: Never  . Sexual activity: Yes

## 2021-04-25 LAB — FUNGAL ORGANISM REFLEX

## 2021-04-25 LAB — FUNGUS CULTURE WITH STAIN

## 2021-04-25 LAB — FUNGUS CULTURE RESULT
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
## 2023-07-01 ENCOUNTER — Emergency Department (HOSPITAL_COMMUNITY)
Admission: EM | Admit: 2023-07-01 | Discharge: 2023-07-01 | Disposition: A | Discharge status: Home / Self Care | Payer: Self-pay | Attending: Emergency Medicine | Admitting: Emergency Medicine

## 2023-07-01 ENCOUNTER — Other Ambulatory Visit: Payer: Self-pay

## 2023-07-01 ENCOUNTER — Emergency Department (HOSPITAL_COMMUNITY): Payer: Self-pay

## 2023-07-01 ENCOUNTER — Encounter (HOSPITAL_COMMUNITY): Payer: Self-pay

## 2023-07-01 DIAGNOSIS — R7309 Other abnormal glucose: Secondary | ICD-10-CM | POA: Insufficient documentation

## 2023-07-01 DIAGNOSIS — R079 Chest pain, unspecified: Secondary | ICD-10-CM

## 2023-07-01 DIAGNOSIS — R072 Precordial pain: Secondary | ICD-10-CM | POA: Insufficient documentation

## 2023-07-01 DIAGNOSIS — I10 Essential (primary) hypertension: Secondary | ICD-10-CM | POA: Insufficient documentation

## 2023-07-01 LAB — BASIC METABOLIC PANEL
Anion gap: 11 (ref 5–15)
BUN: 19 mg/dL (ref 6–20)
CO2: 23 mmol/L (ref 22–32)
Calcium: 9.4 mg/dL (ref 8.9–10.3)
Chloride: 103 mmol/L (ref 98–111)
Creatinine, Ser: 1.04 mg/dL (ref 0.61–1.24)
GFR, Estimated: >60 mL/min (ref >60)
Glucose, Bld: 127 mg/dL — ABNORMAL HIGH (ref 70–99)
Potassium: 3.6 mmol/L (ref 3.5–5.1)
Sodium: 137 mmol/L (ref 135–145)

## 2023-07-01 LAB — TROPONIN I (HIGH SENSITIVITY)
Troponin I (High Sensitivity): 2 ng/L (ref <18)
Troponin I (High Sensitivity): 2 ng/L (ref <18)

## 2023-07-01 LAB — CBC
HCT: 44.6 % (ref 39.0–52.0)
Hemoglobin: 15.5 g/dL (ref 13.0–17.0)
MCH: 30.2 pg (ref 26.0–34.0)
MCHC: 34.8 g/dL (ref 30.0–36.0)
MCV: 86.8 fL (ref 80.0–100.0)
Platelets: 267 10*3/uL (ref 150–400)
RBC: 5.14 MIL/uL (ref 4.22–5.81)
RDW: 13.2 % (ref 11.5–15.5)
WBC: 7.9 10*3/uL (ref 4.0–10.5)
nRBC: 0 % (ref 0.0–0.2)

## 2023-07-01 NOTE — ED Triage Notes (Signed)
Triage done using spanish ipad interpreter: pt reports left sided chest pain (pressure), cold sweats and left arm numbness that started last night but worsened today.

## 2023-07-01 NOTE — Discharge Instructions (Signed)
You were seen in the emergency department today for chest pain.  As we discussed your lab work, EKG, chest x-ray all looked reassuring today.  I think that your symptoms could likely been related to a muscle strain.   You can take some tylenol for pain, or use a warm compress over the area.   Continue to monitor how you are doing overall, and return to the emergency department for any new or worsening symptoms such as: Worsening pain or pain with exertion, difficulty breathing, sweating, or pain or swelling in your legs. _________________________________________________________________________________________________________  Henry Austin atendido hoy en el departamento de emergencias por dolor en el pecho.  Mientras hablbamos de su anlisis de laboratorio, Media planner y la radiografa de trax parecan tranquilizadores hoy.  Creo que sus sntomas probablemente podran estar relacionados con una distensin muscular.   Puede tomar un poco de tylenol para Chief Technology Officer o usar una compresa tibia sobre el rea.   Contine monitoreando su desempeo general y regrese al departamento de emergencias si presenta algn sntoma nuevo o que empeore, como: Empeoramiento del dolor o dolor con el esfuerzo, dificultad para Industrial/product designer, sudoracin o dolor o hinchazn en las piernas.

## 2023-07-01 NOTE — ED Notes (Signed)
Patient transported to X-ray 

## 2023-07-01 NOTE — ED Provider Notes (Signed)
[place] EMERGENCY DEPARTMENT AT [facility] Provider Note   CSN: [id number] Arrival date & time: 07/01/23  0158     History  Chief Complaint  Patient presents with   Chest Pain    Henry Austin is a 32 y.o. male with no significant past medical history presents the emergency department complaining of chest pain starting last night around 10 PM.  Patient states that he was walking around his house when the pain started.  He localized it to his mid sternum and left chest.  He described it as a pinching sensation and pressure.  It radiated into his left arm including his shoulder.  He went to bed, but then when he woke up the discomfort was still there so he wanted to get checked out.  He states that he was feeling very agitated at that time.  He did have some cold sweats.  No shortness of breath, leg pain or swelling.  Denies any recent long travel.  Denies any cardiac history.  He states during his time in the waiting room his pain resolved.   Chest Pain      Home Medications Prior to Admission medications   Medication Sig Start Date End Date Taking? Authorizing Provider  ibuprofen (ADVIL) 200 MG tablet Take 200 mg by mouth every 6 (six) hours as needed for moderate pain.    [provider]  oxyCODONE-acetaminophen (PERCOCET) 5-325 MG tablet Take 1-2 tablets by mouth 2 (two) times daily as needed for severe pain. 04/11/21   Cristie Hem, PA-C      Allergies    Patient has no known allergies.    Review of Systems   Review of Systems  Cardiovascular:  Positive for chest pain.  All other systems reviewed and are negative.   Physical Exam Updated Vital Signs BP (!) 140/92 (BP Location: Right Arm)   Pulse 72   Temp 98.8 F (37.1 C)   Resp 18   Ht 5\' 8"  (1.727 m)   Wt 93 kg   SpO2 97%   BMI 31.17 kg/m  Physical Exam Vitals and nursing note reviewed.  Constitutional:      Appearance: Normal appearance.  HENT:     Head: Normocephalic and  atraumatic.  Eyes:     Conjunctiva/sclera: Conjunctivae normal.  Cardiovascular:     Rate and Rhythm: Normal rate and regular rhythm.  Pulmonary:     Effort: Pulmonary effort is normal. No respiratory distress.     Breath sounds: Normal breath sounds.  Abdominal:     General: There is no distension.     Palpations: Abdomen is soft.     Tenderness: There is no abdominal tenderness.  Musculoskeletal:     Right lower leg: No edema.     Left lower leg: No edema.  Skin:    General: Skin is warm and dry.  Neurological:     General: No focal deficit present.     Mental Status: He is alert.     ED Results / Procedures / Treatments   Labs (all labs ordered are listed, but only abnormal results are displayed) Labs Reviewed  BASIC METABOLIC PANEL - Abnormal; Notable for the following components:      Result Value   Glucose, Bld 127 (*)    All other components within normal limits  CBC  TROPONIN I (HIGH SENSITIVITY)  TROPONIN I (HIGH SENSITIVITY)    EKG EKG Interpretation Date/Time:  Wednesday July 01 2023 02:14:47 EDT Ventricular Rate:  80 PR Interval:  158 QRS Duration:  96 QT Interval:  364 QTC Calculation: 419 R Axis:   107  Text Interpretation: Normal sinus rhythm Rightward axis Cannot rule out Inferior infarct , age undetermined Abnormal ECG No previous ECGs available Confirmed by Dione Booze ([id number]) on 07/01/2023 5:18:41 AM  Radiology DG Chest 2 View  Result Date: 07/01/2023 CLINICAL DATA:  Chest pain. EXAM: CHEST - 2 VIEW COMPARISON:  None Available. FINDINGS: The heart size and mediastinal contours are within normal limits. Both lungs are clear. The visualized skeletal structures are unremarkable. IMPRESSION: No active cardiopulmonary disease. Electronically Signed   By: Elgie Collard M.D.   On: 07/01/2023 02:35    Procedures Procedures    Medications Ordered in ED Medications - No data to display  ED Course/ Medical Decision Making/ A&P              HEART Score: 1                    Medical Decision Making Amount and/or Complexity of Data Reviewed Labs: ordered. Radiology: ordered.   This patient is a 32 y.o. male  who presents to the ED for concern of chest pain.   Differential diagnoses prior to evaluation: The emergent differential diagnosis includes, but is not limited to,  ACS, pericarditis, myocarditis, aortic dissection, PE, pneumothorax, esophageal spasm or rupture, chronic angina, pneumonia, bronchitis, GERD, reflux/PUD, biliary disease, pancreatitis, costochondritis, anxiety. This is not an exhaustive differential.   Past Medical History / Co-morbidities / Social History: No significant PMH  Physical Exam: Physical exam performed. The pertinent findings include: Mildly hypertensive, otherwise normal vital signs.  In no acute distress.  Heart regular rate and rhythm, lung sounds clear, normal respiratory effort.  No peripheral edema.  Lab Tests/Imaging studies: I personally interpreted labs/imaging and the pertinent results include: CBC normal.  Mildly elevated glucose, otherwise BMP normal.  Flat troponins at 2.  Chest x-ray without acute abnormalities.. I agree with the radiologist interpretation.  Cardiac monitoring: EKG obtained and interpreted by myself and attending physician which shows: Normal sinus rhythm with rightward axis   Disposition: After consideration of the diagnostic results and the patients response to treatment, I feel that emergency department workup does not suggest an emergent condition requiring admission or immediate intervention beyond what has been performed at this time. Patient is to be discharged with recommendation to follow up with PCP in regards to today's hospital visit. Chest pain is not likely of cardiac or pulmonary etiology d/t presentation, PERC criteria for PE negative, VSS, no tracheal deviation, no JVD or new murmur, RRR, breath sounds equal bilaterally, EKG without acute  abnormalities, negative troponin, and negative CXR. Heart score of 1. Pt has been advised to return to the ED if CP becomes exertional, associated with diaphoresis or nausea, radiates to left jaw/arm, worsens or becomes concerning in any way. Pt appears reliable for follow up and is agreeable to discharge.   Final Clinical Impression(s) / ED Diagnoses Final diagnoses:  Nonspecific chest pain    Rx / DC Orders ED Discharge Orders     None      Portions of this report may have been transcribed using voice recognition software. Every effort was made to ensure accuracy; however, inadvertent computerized transcription errors may be present.    Jeanella Flattery 07/01/23 0749    Gerhard Munch, MD 07/01/23 [phone]
